# Patient Record
Sex: Male | Born: 1990 | Race: Black or African American | Hispanic: No | Marital: Single | Smoking: Current some day smoker
Health system: Southern US, Community
[De-identification: ages and names within clinical notes are randomized; demographics above are authoritative.]

---
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
## 2011-03-09 LAB — CBC
Hemoglobin: 14.8 g/dL (ref 13.0–17.0)
MCHC: 32.6 g/dL (ref 30.0–36.0)
MCV: 91.3 fL (ref 78.0–100.0)
RBC: 4.98 MIL/uL (ref 4.22–5.81)
RDW: 12.6 % (ref 11.5–15.5)

## 2011-03-09 LAB — RAPID URINE DRUG SCREEN, HOSP PERFORMED
Amphetamines: NOT DETECTED
Barbiturates: NOT DETECTED
Opiates: NOT DETECTED
Tetrahydrocannabinol: POSITIVE — AB

## 2011-03-09 LAB — POCT I-STAT, CHEM 8
BUN: 7 mg/dL (ref 6–23)
Calcium, Ion: 1.16 mmol/L (ref 1.12–1.32)
Chloride: 100 meq/L (ref 96–112)
HCT: 48 % (ref 39.0–52.0)
Potassium: 3.1 meq/L — ABNORMAL LOW (ref 3.5–5.1)

## 2011-11-08 ENCOUNTER — Encounter: Payer: Self-pay | Admitting: *Deleted

## 2011-11-08 ENCOUNTER — Emergency Department (HOSPITAL_COMMUNITY)
Admission: EM | Admit: 2011-11-08 | Discharge: 2011-11-08 | Disposition: A | Discharge status: Home / Self Care | Payer: Self-pay | Attending: Emergency Medicine | Admitting: Emergency Medicine

## 2011-11-08 DIAGNOSIS — IMO0002 Reserved for concepts with insufficient information to code with codable children: Secondary | ICD-10-CM | POA: Insufficient documentation

## 2011-11-08 DIAGNOSIS — S21109A Unspecified open wound of unspecified front wall of thorax without penetration into thoracic cavity, initial encounter: Secondary | ICD-10-CM | POA: Insufficient documentation

## 2011-11-08 MED ORDER — LIDOCAINE HCL (PF) 1 % IJ SOLN
5.0000 mL | Freq: Once | INTRAMUSCULAR | Status: AC
  Administered 2011-11-08: 5 mL
  Filled 2011-11-08: qty 5

## 2011-11-08 MED ORDER — TETANUS-DIPHTH-ACELL PERTUSSIS 5-2.5-18.5 LF-MCG/0.5 IM SUSP
0.5000 mL | Freq: Once | INTRAMUSCULAR | Status: AC
  Administered 2011-11-08: 0.5 mL via INTRAMUSCULAR
  Filled 2011-11-08: qty 0.5

## 2011-11-08 NOTE — ED Notes (Signed)
D/c instructions reviewed w/ pt - pt denies any further questions or concerns at present.   
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
## 2014-12-27 ENCOUNTER — Encounter (HOSPITAL_COMMUNITY): Payer: Self-pay | Admitting: Emergency Medicine

## 2014-12-27 ENCOUNTER — Emergency Department (HOSPITAL_COMMUNITY)
Admission: EM | Admit: 2014-12-27 | Discharge: 2014-12-27 | Disposition: A | Discharge status: Home / Self Care | Payer: Self-pay | Attending: Emergency Medicine | Admitting: Emergency Medicine

## 2014-12-27 DIAGNOSIS — Y9289 Other specified places as the place of occurrence of the external cause: Secondary | ICD-10-CM | POA: Insufficient documentation

## 2014-12-27 DIAGNOSIS — Y9389 Activity, other specified: Secondary | ICD-10-CM | POA: Insufficient documentation

## 2014-12-27 DIAGNOSIS — Z720 Tobacco use: Secondary | ICD-10-CM | POA: Insufficient documentation

## 2014-12-27 DIAGNOSIS — Y998 Other external cause status: Secondary | ICD-10-CM | POA: Insufficient documentation

## 2014-12-27 DIAGNOSIS — W260XXA Contact with knife, initial encounter: Secondary | ICD-10-CM | POA: Insufficient documentation

## 2014-12-27 DIAGNOSIS — S41112A Laceration without foreign body of left upper arm, initial encounter: Secondary | ICD-10-CM

## 2014-12-27 DIAGNOSIS — S51812A Laceration without foreign body of left forearm, initial encounter: Secondary | ICD-10-CM | POA: Insufficient documentation

## 2014-12-27 MED ORDER — LIDOCAINE-EPINEPHRINE 2 %-1:100000 IJ SOLN
20.0000 mL | Freq: Once | INTRAMUSCULAR | Status: AC
  Administered 2014-12-27: 20 mL via INTRADERMAL
  Filled 2014-12-27: qty 1
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
## 2014-12-27 NOTE — Discharge Instructions (Signed)
Keep sutures clean and dry. °Follow up with urgent care in 1 week for suture removal. °Return to the ED for new concerns. °
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
## 2017-03-20 ENCOUNTER — Emergency Department (HOSPITAL_COMMUNITY)
Admission: EM | Admit: 2017-03-20 | Discharge: 2017-03-20 | Disposition: A | Discharge status: Home / Self Care | Payer: Self-pay | Attending: Emergency Medicine | Admitting: Emergency Medicine

## 2017-03-20 ENCOUNTER — Encounter (HOSPITAL_COMMUNITY): Payer: Self-pay | Admitting: Emergency Medicine

## 2017-03-20 DIAGNOSIS — F17210 Nicotine dependence, cigarettes, uncomplicated: Secondary | ICD-10-CM | POA: Insufficient documentation

## 2017-03-20 DIAGNOSIS — J039 Acute tonsillitis, unspecified: Secondary | ICD-10-CM | POA: Insufficient documentation

## 2017-03-20 MED ORDER — PENICILLIN G BENZATHINE 1200000 UNIT/2ML IM SUSP
1.2000 10*6.[IU] | Freq: Once | INTRAMUSCULAR | Status: AC
  Administered 2017-03-20: 1.2 10*6.[IU] via INTRAMUSCULAR
  Filled 2017-03-20: qty 2

## 2017-03-20 MED ORDER — DEXAMETHASONE SODIUM PHOSPHATE 10 MG/ML IJ SOLN
10.0000 mg | Freq: Once | INTRAMUSCULAR | Status: AC
  Administered 2017-03-20: 10 mg via INTRAMUSCULAR
  Filled 2017-03-20: qty 1
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
## 2017-03-20 NOTE — ED Provider Notes (Signed)
[facility]EMER[facility]ENCY [facility] Provider Note   CSN: [id number] Arrival date & time: 03/20/17  0017   By signing my name below, I, Clovis Pu, attest that this documentation has been prepared under the direction and in the presence of Gilda Crease, MD  Electronically Signed: Clovis Pu, ED Scribe. 03/20/17. 1:08 AM.   History   Chief Complaint Chief Complaint  Patient presents with  . Sore Throat  . Headache    HPI Comments:  Garrett Johnson is a 26 y.o. male who presents to the Emergency Department complaining of acute onset, moderate sore throat x 4 days. Pt also reports chills and recent sick contacts with strep throat. No alleviating or exacerbating factors noted. Pt denies a cough, congestion or any other associated symptoms. He also denies medication allergies. No other complaints noted at this time.   The history is provided by the patient. No language interpreter was used.    History reviewed. No pertinent past medical history.  There are no active problems to display for this patient.   History reviewed. No pertinent surgical history.   Home Medications    Prior to Admission medications   Medication Sig Start Date End Date Taking? Authorizing Provider  acetaminophen (TYLENOL) 325 MG tablet Take 325-650 mg by mouth every 6 (six) hours as needed. Headache or pain     Historical Provider, MD    Family History History reviewed. No pertinent family history.  Social History Social History  Substance Use Topics  . Smoking status: Current Some Day Smoker    Types: Cigarettes  . Smokeless tobacco: Never Used  . Alcohol use Yes     Allergies   Shellfish allergy   Review of Systems Review of Systems  Constitutional: Positive for chills.  HENT: Positive for sore throat.   All other systems reviewed and are negative.  Physical Exam Updated Vital Signs BP (!) 95/57 (BP Location: Left Arm)   Pulse 61   Temp 97.9 F (36.6 C) (Oral)   Resp 18   Ht   (1.854 m)   Wt 160 lb (72.6 kg)   SpO2 98%   BMI 21.11 kg/m   Physical Exam  Constitutional: He is oriented to person, place, and time. He appears well-developed and well-nourished. No distress.  HENT:  Head: Normocephalic and atraumatic.  Right Ear: Hearing normal.  Left Ear: Hearing normal.  Nose: Nose normal.  Mouth/Throat: Uvula is midline and mucous membranes are normal. No dental abscesses. Posterior oropharyngeal erythema present. Tonsillar exudate.  Eyes: Conjunctivae and EOM are normal. Pupils are equal, round, and reactive to light.  Neck: Normal range of motion. Neck supple.  Cardiovascular: Regular rhythm, S1 normal and S2 normal.  Exam reveals no gallop and no friction rub.   No murmur heard. Pulmonary/Chest: Effort normal and breath sounds normal. No respiratory distress. He exhibits no tenderness.  Abdominal: Soft. Normal appearance and bowel sounds are normal. There is no hepatosplenomegaly. There is no tenderness. There is no rebound, no guarding, no tenderness at McBurney's point and negative Murphy's sign. No hernia.  Musculoskeletal: Normal range of motion.  Neurological: He is alert and oriented to person, place, and time. He has normal strength. No cranial nerve deficit or sensory deficit. Coordination normal. GCS eye subscore is 4. GCS verbal subscore is 5. GCS motor subscore is 6.  Skin: Skin is warm, dry and intact. No rash noted. No cyanosis.  Psychiatric: He has a normal mood and affect. His speech is normal and behavior is normal.  Thought content normal.  Nursing note and vitals reviewed.  ED Treatments / Results  DIAGNOSTIC STUDIES:  Oxygen Saturation is 98% on RA, normal by my interpretation.    COORDINATION OF CARE:  1:00 AM Discussed treatment plan with pt at bedside and pt agreed to plan.  Labs (all labs ordered are listed, but only abnormal results are displayed) Labs Reviewed - No data to display  EKG  EKG Interpretation None        Radiology No results found.  Procedures Procedures (including critical care time)  Medications Ordered in ED Medications  penicillin g benzathine (BICILLIN LA) 1200000 UNIT/2ML injection 1.2 Million Units (not administered)  dexamethasone (DECADRON) injection 10 mg (not administered)     Initial Impression / Assessment and Plan / ED Course  I have reviewed the triage vital signs and the nursing notes.  Pertinent labs & imaging results that were available during my care of the patient were reviewed by me and considered in my medical decision making (see chart for details).     Patient presents with complaints of 4 day history of chills and sore throat. Examination reveals evidence of tonsillitis. No other upper respiratory symptoms.  Final Clinical Impressions(s) / ED Diagnoses   Final diagnoses:  Tonsillitis    New Prescriptions New Prescriptions   No medications on file  I personally performed the services described in this documentation, which was scribed in my presence. The recorded information has been reviewed and is accurate.     Gilda Crease, MD 03/20/17 [phone]

## 2017-03-20 NOTE — ED Triage Notes (Signed)
Pt states he has a headache and sore throat x 4 days  Pt states he has had chills

## 2017-12-09 ENCOUNTER — Ambulatory Visit (INDEPENDENT_AMBULATORY_CARE_PROVIDER_SITE_OTHER): Payer: Self-pay

## 2017-12-09 ENCOUNTER — Other Ambulatory Visit: Payer: Self-pay

## 2017-12-09 ENCOUNTER — Encounter (HOSPITAL_COMMUNITY): Payer: Self-pay

## 2017-12-09 ENCOUNTER — Ambulatory Visit (HOSPITAL_COMMUNITY)
Admission: EM | Admit: 2017-12-09 | Discharge: 2017-12-09 | Disposition: A | Discharge status: Home / Self Care | Payer: Self-pay | Attending: Internal Medicine | Admitting: Internal Medicine

## 2017-12-09 DIAGNOSIS — M79641 Pain in right hand: Secondary | ICD-10-CM

## 2017-12-09 MED ORDER — MELOXICAM 15 MG PO TABS: 15.0000 mg | ORAL_TABLET | Freq: Every day | ORAL | 0 refills | Status: AC

## 2017-12-09 NOTE — Discharge Instructions (Signed)
As discussed, x-ray showed chronic deformity of the right ring finger.  Given recent injury, with pain at that location, and unable to move your fingers, I will splint you and have you follow-up with orthopedic for further evaluation.  Start Mobic as directed.  Continue ice compress. Follow up with orthopedics for further evaluation needed.

## 2017-12-09 NOTE — ED Provider Notes (Signed)
[facility]    CSN: [id number] Arrival date & time: 12/09/17  1725     History   Chief Complaint Chief Complaint  Patient presents with  . Hand Injury    HPI Rease Gully is a 27 y.o. male.   27 year old male comes in for right hand pain after injury 3 days ago.  Patient states he was moving furniture and dropped entertainment center on the hand. He has had swelling of the right middle finger MCP joint. But pain extends down to the wrist. Denies numbness/tingling. Has been putting ice with some relief. Due to the pain, he took hydrocodone a family members hydrocodone, states just put him to sleep, but doesn't help with the pain.  He has had decreased range of motion of the right middle finger.      History reviewed. No pertinent past medical history.  There are no active problems to display for this patient.   History reviewed. No pertinent surgical history.     Home Medications    Prior to Admission medications   Medication Sig Start Date End Date Taking? Authorizing Provider  acetaminophen (TYLENOL) 325 MG tablet Take 325-650 mg by mouth every 6 (six) hours as needed. Headache or pain     [provider]  meloxicam (MOBIC) 15 MG tablet Take 1 tablet (15 mg total) by mouth daily. 12/09/17   Belinda Fisher, PA-C    Family History History reviewed. No pertinent family history.  Social History Social History   Tobacco Use  . Smoking status: Current Some Day Smoker    Types: Cigarettes  . Smokeless tobacco: Never Used  Substance Use Topics  . Alcohol use: Yes  . Drug use: No     Allergies   Shellfish allergy   Review of Systems Review of Systems  Reason unable to perform ROS: See HPI as above.     Physical Exam Triage Vital Signs ED Triage Vitals  Enc Vitals Group     BP 12/09/17 1757 118/72     Pulse Rate 12/09/17 1757 87     Resp 12/09/17 1757 16     Temp 12/09/17 1757 99.1 F (37.3 C)     Temp Source 12/09/17 1757 Oral   SpO2 12/09/17 1757 97 %     Weight --      Height --      Head Circumference --      Peak Flow --      Pain Score 12/09/17 1759 8     Pain Loc --      Pain Edu? --      Excl. in GC? --    No data found.  Updated Vital Signs BP 118/72 (BP Location: Left Arm)   Pulse 87   Temp 99.1 F (37.3 C) (Oral)   Resp 16   SpO2 97%   Physical Exam  Constitutional: He is oriented to person, place, and time. He appears well-developed and well-nourished. No distress.  HENT:  Head: Normocephalic and atraumatic.  Eyes: Conjunctivae are normal. Pupils are equal, round, and reactive to light.  Musculoskeletal:  Swelling of the right middle MCP joint without erythema or increased warmth.  Tenderness on palpation of right middle finger, extending down to the MCP. Tenderness to palpation of right ring finger. Deformity felt on right 4th MCP. Decreased range of motion.  Strength deferred.  Sensation intact.  Cap refill less than 2 seconds.  Neurological: He is alert and oriented to person, place, and time.  UC Treatments / Results  Labs (all labs ordered are listed, but only abnormal results are displayed) Labs Reviewed - No data to display  EKG  EKG Interpretation None       Radiology Dg Hand Complete Right  Result Date: 12/09/2017 CLINICAL DATA:  Trauma to the right hand on Tuesday with pain. EXAM: RIGHT HAND - COMPLETE 3+ VIEW COMPARISON:  None. FINDINGS: There is no evidence of fracture or dislocation. There is chronic deformity of the fourth metacarpal. Soft tissues are unremarkable. IMPRESSION: No acute fracture or dislocation. Electronically Signed   By: Sherian ReinWei-Chen  Lin M.D.   On: 12/09/2017 18:32    Procedures Procedures (including critical care time)  Medications Ordered in UC Medications - No data to display   Initial Impression / Assessment and Plan / UC Course  I have reviewed the triage vital signs and the nursing notes.  Pertinent labs & imaging results that were  available during my care of the patient were reviewed by me and considered in my medical decision making (see chart for details).    Discussed x-ray results with patient.  Patient denies old injury of right hand.  States has not noticed deformity in the past, and believes it is new.  Will provide splint and have patient follow-up with orthopedics for further evaluation.  Start Mobic, ice compress. Follow up with orthopedics for further evaluation. Return precautions given. Patient expresses understanding and agrees to plan.   Final Clinical Impressions(s) / UC Diagnoses   Final diagnoses:  Right hand pain    ED Discharge Orders        Ordered    meloxicam (MOBIC) 15 MG tablet  Daily     12/09/17 1850        Belinda FisherYu, Ionia Schey V, PA-C 12/09/17 2038

## 2017-12-09 NOTE — Progress Notes (Signed)
Orthopedic Tech Progress Note Patient Details:  Greig Rightyrell Lugar [birth year] [id number]  Ortho Devices Type of Ortho Device: Ace wrap, Ulna gutter splint Ortho Device/Splint Location: RUE Ortho Device/Splint Interventions: Ordered, Application   Post Interventions Patient Tolerated: Well Instructions Provided: Care of device   Jennye MoccasinHughes, Brieonna Crutcher Craig 12/09/2017, 7:22 PM

## 2017-12-09 NOTE — ED Notes (Signed)
Ortho tech aware of splint order 
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
